# Patient Record
Sex: Male | Born: 1985 | Hispanic: No | Marital: Single | Smoking: Never smoker
Health system: Southern US, Community
[De-identification: ages and names within clinical notes are randomized; demographics above are authoritative.]

## PROBLEM LIST (undated history)

## (undated) ENCOUNTER — Emergency Department: Discharge status: Absent without leave | Payer: 59

## (undated) DIAGNOSIS — R519 Headache, unspecified: Secondary | ICD-10-CM

## (undated) DIAGNOSIS — R42 Dizziness and giddiness: Secondary | ICD-10-CM

## (undated) DIAGNOSIS — R51 Headache: Secondary | ICD-10-CM

## (undated) HISTORY — PX: SHOULDER SURGERY: SHX246

---

## 2014-08-14 ENCOUNTER — Other Ambulatory Visit: Payer: Self-pay | Admitting: Otolaryngology

## 2014-08-14 DIAGNOSIS — J320 Chronic maxillary sinusitis: Secondary | ICD-10-CM

## 2014-08-14 DIAGNOSIS — R0981 Nasal congestion: Secondary | ICD-10-CM

## 2014-08-17 ENCOUNTER — Ambulatory Visit
Admission: RE | Admit: 2014-08-17 | Discharge: 2014-08-17 | Disposition: A | Discharge status: Home / Self Care | Payer: 59 | Source: Ambulatory Visit | Attending: Otolaryngology | Admitting: Otolaryngology

## 2014-08-17 DIAGNOSIS — R0981 Nasal congestion: Secondary | ICD-10-CM

## 2014-08-17 DIAGNOSIS — J320 Chronic maxillary sinusitis: Secondary | ICD-10-CM

## 2015-01-08 NOTE — H&P (Signed)
PREOPERATIVE H&P  Chief Complaint: sinus infections and nasal obstruction  HPI: Larry Pitts is a 29 y.o. male who presents for evaluation of sinus infections and nasal obstruction for a number of years. He has a lot of pressure between his eyes and in the cheek region. A CT scan of the sinus demonstrated a significant septal deviation to the right and mild sinus disease in the anterior ethmoid region and maxillary sinuses. He's taken to the OR for septoplasty, TR and FESS.  No past medical history on file. No past surgical history on file. History   Social History  . Marital Status: Single    Spouse Name: N/A  . Number of Children: N/A  . Years of Education: N/A   Social History Main Topics  . Smoking status: Not on file  . Smokeless tobacco: Not on file  . Alcohol Use: Not on file  . Drug Use: Not on file  . Sexual Activity: Not on file   Other Topics Concern  . Not on file   Social History Narrative  . No narrative on file   No family history on file. Allergies not on file Prior to Admission medications   Not on File     Positive ROS: per HPI  All other systems have been reviewed and were otherwise negative with the exception of those mentioned in the HPI and as above.  Physical Exam: There were no vitals filed for this visit.  General: Alert, no acute distress Oral: Normal oral mucosa and tonsils Nasal: Septal deviation to the right with large turbinates. No polyps. Neck: No palpable adenopathy or thyroid nodules Ear: Ear canal is clear with normal appearing TMs Cardiovascular: Regular rate and rhythm, no murmur.  Respiratory: Clear to auscultation Neurologic: Alert and oriented x 3   Assessment/Plan: SINUS DISEASE, DEVIATED SEPTUM Plan for Procedure(s): NASAL SEPTOPLASTY WITH TURBINATE REDUCTION ANTERIOR ETHMOIDECTOMY BILATERIAL MAXILLARY OSTIA ENLARGEMENT   Dillard Cannon, MD 01/08/2015 2:44 PM

## 2015-01-09 ENCOUNTER — Other Ambulatory Visit: Payer: Self-pay | Admitting: Otolaryngology

## 2015-01-11 ENCOUNTER — Encounter (HOSPITAL_COMMUNITY): Payer: Self-pay

## 2015-01-11 ENCOUNTER — Encounter (HOSPITAL_COMMUNITY)
Admission: RE | Admit: 2015-01-11 | Discharge: 2015-01-11 | Disposition: A | Discharge status: Home / Self Care | Payer: Commercial Managed Care - HMO | Source: Ambulatory Visit | Attending: Otolaryngology | Admitting: Otolaryngology

## 2015-01-11 DIAGNOSIS — J329 Chronic sinusitis, unspecified: Secondary | ICD-10-CM | POA: Insufficient documentation

## 2015-01-11 DIAGNOSIS — Z01812 Encounter for preprocedural laboratory examination: Secondary | ICD-10-CM | POA: Diagnosis present

## 2015-01-11 DIAGNOSIS — J342 Deviated nasal septum: Secondary | ICD-10-CM | POA: Diagnosis not present

## 2015-01-11 HISTORY — DX: Headache, unspecified: R51.9

## 2015-01-11 HISTORY — DX: Dizziness and giddiness: R42

## 2015-01-11 HISTORY — DX: Headache: R51

## 2015-01-11 LAB — CBC
HCT: 45.2 % (ref 39.0–52.0)
HEMOGLOBIN: 15.4 g/dL (ref 13.0–17.0)
MCH: 29.6 pg (ref 26.0–34.0)
MCHC: 34.1 g/dL (ref 30.0–36.0)
MCV: 86.9 fL (ref 78.0–100.0)
PLATELETS: 249 10*3/uL (ref 150–400)
RBC: 5.2 MIL/uL (ref 4.22–5.81)
RDW: 12.9 % (ref 11.5–15.5)
WBC: 4.3 10*3/uL (ref 4.0–10.5)

## 2015-01-11 NOTE — Pre-Procedure Instructions (Signed)
    Larry Pitts  01/11/2015      [id number] - [place], [place] - [address] [place] [address] Phone: [phone] Fax: [phone]    Your procedure is scheduled on 01/17/15.  Report to [facility] Admitting at 845 A.M.  Call this number if you have problems the morning of surgery:  [phone]   Remember:  Do not eat food or drink liquids after midnight.  Take these medicines the morning of surgery with A SIP OF WATER --allegra,flonase   Do not wear jewelry, make-up or nail polish.  Do not wear lotions, powders, or perfumes.  You may wear deodorant.  Do not shave 48 hours prior to surgery.  Men may shave face and neck.  Do not bring valuables to the hospital.  [facility] is not responsible for any belongings or valuables.  Contacts, dentures or bridgework may not be worn into surgery.  Leave your suitcase in the car.  After surgery it may be brought to your room.  For patients admitted to the hospital, discharge time will be determined by your treatment team.  Patients discharged the day of surgery will not be allowed to drive home.   Name and phone number of your driver:    Special instructions:    Please read over the following fact sheets that you were given. Pain Booklet, Coughing and Deep Breathing and Surgical Site Infection Prevention

## 2015-01-16 MED ORDER — CEFAZOLIN SODIUM-DEXTROSE 2-3 GM-% IV SOLR
2.0000 g | INTRAVENOUS | Status: AC
  Administered 2015-01-17: 2 g via INTRAVENOUS
  Filled 2015-01-16: qty 50

## 2015-01-17 ENCOUNTER — Ambulatory Visit (HOSPITAL_COMMUNITY)
Admission: RE | Admit: 2015-01-17 | Discharge: 2015-01-17 | Disposition: A | Discharge status: Home / Self Care | Payer: Commercial Managed Care - HMO | Source: Ambulatory Visit | Attending: Otolaryngology | Admitting: Otolaryngology

## 2015-01-17 ENCOUNTER — Encounter (HOSPITAL_COMMUNITY): Payer: Self-pay | Admitting: General Practice

## 2015-01-17 ENCOUNTER — Ambulatory Visit (HOSPITAL_COMMUNITY): Payer: Commercial Managed Care - HMO | Admitting: Anesthesiology

## 2015-01-17 ENCOUNTER — Encounter (HOSPITAL_COMMUNITY)
Admission: RE | Disposition: A | Discharge status: Home / Self Care | Payer: Self-pay | Source: Ambulatory Visit | Attending: Otolaryngology

## 2015-01-17 DIAGNOSIS — J342 Deviated nasal septum: Secondary | ICD-10-CM | POA: Diagnosis not present

## 2015-01-17 DIAGNOSIS — J343 Hypertrophy of nasal turbinates: Secondary | ICD-10-CM | POA: Insufficient documentation

## 2015-01-17 HISTORY — PX: ETHMOIDECTOMY: SHX5197

## 2015-01-17 HISTORY — PX: NASAL SEPTOPLASTY W/ TURBINOPLASTY: SHX2070

## 2015-01-17 HISTORY — PX: MAXILLARY ANTROSTOMY: SHX2003

## 2015-01-17 SURGERY — SEPTOPLASTY, NOSE, WITH NASAL TURBINATE REDUCTION
Anesthesia: General | Site: Nose | Laterality: Bilateral

## 2015-01-17 MED ORDER — LIDOCAINE HCL (CARDIAC) 20 MG/ML IV SOLN
INTRAVENOUS | Status: DC | PRN
  Administered 2015-01-17: 50 mg via INTRAVENOUS

## 2015-01-17 MED ORDER — BACITRACIN ZINC 500 UNIT/GM EX OINT
TOPICAL_OINTMENT | CUTANEOUS | Status: DC | PRN
  Administered 2015-01-17: 1 via TOPICAL

## 2015-01-17 MED ORDER — DEXAMETHASONE SODIUM PHOSPHATE 10 MG/ML IJ SOLN
INTRAMUSCULAR | Status: DC | PRN
  Administered 2015-01-17: 10 mg via INTRAVENOUS

## 2015-01-17 MED ORDER — CEPHALEXIN 500 MG PO CAPS: 500.0000 mg | ORAL_CAPSULE | Freq: Two times a day (BID) | ORAL | Status: AC

## 2015-01-17 MED ORDER — HYDROCODONE-ACETAMINOPHEN 5-325 MG PO TABS: 1.0000 | ORAL_TABLET | Freq: Four times a day (QID) | ORAL | Status: AC | PRN

## 2015-01-17 MED ORDER — SODIUM CHLORIDE 0.9 % IR SOLN
Status: DC | PRN
  Administered 2015-01-17: 1000 mL

## 2015-01-17 MED ORDER — HYDROMORPHONE HCL 1 MG/ML IJ SOLN
INTRAMUSCULAR | Status: AC
  Filled 2015-01-17: qty 1

## 2015-01-17 MED ORDER — OXYMETAZOLINE HCL 0.05 % NA SOLN
NASAL | Status: DC | PRN
  Administered 2015-01-17: 1

## 2015-01-17 MED ORDER — FENTANYL CITRATE (PF) 250 MCG/5ML IJ SOLN
INTRAMUSCULAR | Status: AC
  Filled 2015-01-17: qty 5

## 2015-01-17 MED ORDER — PHENYLEPHRINE HCL 10 MG/ML IJ SOLN
INTRAMUSCULAR | Status: DC | PRN
  Administered 2015-01-17: 40 ug via INTRAVENOUS

## 2015-01-17 MED ORDER — FENTANYL CITRATE (PF) 100 MCG/2ML IJ SOLN
INTRAMUSCULAR | Status: DC | PRN
  Administered 2015-01-17 (×3): 50 ug via INTRAVENOUS
  Administered 2015-01-17: 100 ug via INTRAVENOUS

## 2015-01-17 MED ORDER — GLYCOPYRROLATE 0.2 MG/ML IJ SOLN
INTRAMUSCULAR | Status: DC | PRN
  Administered 2015-01-17: 0.4 mg via INTRAVENOUS

## 2015-01-17 MED ORDER — MIDAZOLAM HCL 5 MG/5ML IJ SOLN
INTRAMUSCULAR | Status: DC | PRN
  Administered 2015-01-17: 2 mg via INTRAVENOUS

## 2015-01-17 MED ORDER — ONDANSETRON HCL 4 MG/2ML IJ SOLN
INTRAMUSCULAR | Status: DC | PRN
  Administered 2015-01-17: 4 mg via INTRAVENOUS

## 2015-01-17 MED ORDER — LACTATED RINGERS IV SOLN
INTRAVENOUS | Status: DC | PRN
  Administered 2015-01-17 (×2): via INTRAVENOUS

## 2015-01-17 MED ORDER — LACTATED RINGERS IV SOLN
INTRAVENOUS | Status: DC
  Administered 2015-01-17: 09:00:00 via INTRAVENOUS

## 2015-01-17 MED ORDER — DEXAMETHASONE SODIUM PHOSPHATE 10 MG/ML IJ SOLN
INTRAMUSCULAR | Status: AC
Start: 2015-01-17 — End: 2015-01-17
  Filled 2015-01-17: qty 1

## 2015-01-17 MED ORDER — NEOSTIGMINE METHYLSULFATE 10 MG/10ML IV SOLN
INTRAVENOUS | Status: AC
  Filled 2015-01-17: qty 1

## 2015-01-17 MED ORDER — PROPOFOL 10 MG/ML IV BOLUS
INTRAVENOUS | Status: DC | PRN
  Administered 2015-01-17: 150 mg via INTRAVENOUS

## 2015-01-17 MED ORDER — ROCURONIUM BROMIDE 100 MG/10ML IV SOLN
INTRAVENOUS | Status: DC | PRN
  Administered 2015-01-17: 40 mg via INTRAVENOUS

## 2015-01-17 MED ORDER — LIDOCAINE-EPINEPHRINE 1 %-1:100000 IJ SOLN
INTRAMUSCULAR | Status: AC
  Filled 2015-01-17: qty 1

## 2015-01-17 MED ORDER — LIDOCAINE-EPINEPHRINE 1 %-1:100000 IJ SOLN
INTRAMUSCULAR | Status: DC | PRN
  Administered 2015-01-17: 20 mL

## 2015-01-17 MED ORDER — LIDOCAINE HCL (CARDIAC) 20 MG/ML IV SOLN
INTRAVENOUS | Status: AC
  Filled 2015-01-17: qty 5

## 2015-01-17 MED ORDER — ONDANSETRON HCL 4 MG/2ML IJ SOLN
INTRAMUSCULAR | Status: AC
  Filled 2015-01-17: qty 2

## 2015-01-17 MED ORDER — HYDROMORPHONE HCL 1 MG/ML IJ SOLN
0.2500 mg | INTRAMUSCULAR | Status: DC | PRN
  Administered 2015-01-17: 0.25 mg via INTRAVENOUS

## 2015-01-17 MED ORDER — 0.9 % SODIUM CHLORIDE (POUR BTL) OPTIME
TOPICAL | Status: DC | PRN
  Administered 2015-01-17: 1000 mL

## 2015-01-17 MED ORDER — PROPOFOL 10 MG/ML IV BOLUS
INTRAVENOUS | Status: AC
  Filled 2015-01-17: qty 20

## 2015-01-17 MED ORDER — NEOSTIGMINE METHYLSULFATE 10 MG/10ML IV SOLN
INTRAVENOUS | Status: DC | PRN
  Administered 2015-01-17: 3 mg via INTRAVENOUS

## 2015-01-17 MED ORDER — BACITRACIN ZINC 500 UNIT/GM EX OINT
TOPICAL_OINTMENT | CUTANEOUS | Status: AC
  Filled 2015-01-17: qty 28.35

## 2015-01-17 MED ORDER — GLYCOPYRROLATE 0.2 MG/ML IJ SOLN
INTRAMUSCULAR | Status: AC
  Filled 2015-01-17: qty 2

## 2015-01-17 MED ORDER — OXYMETAZOLINE HCL 0.05 % NA SOLN
NASAL | Status: AC
  Filled 2015-01-17: qty 15

## 2015-01-17 MED ORDER — PHENYLEPHRINE 40 MCG/ML (10ML) SYRINGE FOR IV PUSH (FOR BLOOD PRESSURE SUPPORT)
PREFILLED_SYRINGE | INTRAVENOUS | Status: AC
  Filled 2015-01-17: qty 10

## 2015-01-17 MED ORDER — MIDAZOLAM HCL 2 MG/2ML IJ SOLN
INTRAMUSCULAR | Status: AC
  Filled 2015-01-17: qty 4

## 2015-01-17 MED ORDER — SUCCINYLCHOLINE CHLORIDE 20 MG/ML IJ SOLN
INTRAMUSCULAR | Status: AC
  Filled 2015-01-17: qty 1

## 2015-01-17 SURGICAL SUPPLY — 69 items
ATTRACTOMAT 16X20 MAGNETIC DRP (DRAPES) IMPLANT
BLADE 10 SAFETY STRL DISP (BLADE) ×3 IMPLANT
BLADE INF TURB ROT M4 2 5PK (BLADE) ×2 IMPLANT
BLADE INF TURB ROT M4 2MM 5PK (BLADE) ×1
BLADE RAD40 ROTATE 4M 4 5PK (BLADE) IMPLANT
BLADE RAD40 ROTATE 4M 4MM 5PK (BLADE)
BLADE RAD60 ROTATE M4 4 5PK (BLADE) IMPLANT
BLADE RAD60 ROTATE M4 4MM 5PK (BLADE)
BLADE SURG 15 STRL LF DISP TIS (BLADE) IMPLANT
BLADE SURG 15 STRL SS (BLADE)
BLADE TRICUT ROTATE M4 4 5PK (BLADE) ×2 IMPLANT
BLADE TRICUT ROTATE M4 4MM 5PK (BLADE) ×1
CANISTER SUCTION 2500CC (MISCELLANEOUS) ×3 IMPLANT
CLEANER TIP ELECTROSURG 2X2 (MISCELLANEOUS) IMPLANT
CLOSURE WOUND 1/2 X4 (GAUZE/BANDAGES/DRESSINGS)
COAGULATOR SUCT 8FR VV (MISCELLANEOUS) ×3 IMPLANT
DRAPE PROXIMA HALF (DRAPES) IMPLANT
DRESSING NASAL KENNEDY 3.5X.9 (MISCELLANEOUS) IMPLANT
DRESSING TELFA 8X3 (GAUZE/BANDAGES/DRESSINGS) ×3 IMPLANT
DRSG NASAL KENNEDY 3.5X.9 (MISCELLANEOUS)
DRSG NASOPORE 8CM (GAUZE/BANDAGES/DRESSINGS) ×6 IMPLANT
DRSG TELFA 3X8 NADH (GAUZE/BANDAGES/DRESSINGS) ×3 IMPLANT
ELECT CAUTERY BLADE 6.4 (BLADE) ×3 IMPLANT
ELECT COATED BLADE 2.86 ST (ELECTRODE) ×3 IMPLANT
ELECT REM PT RETURN 9FT ADLT (ELECTROSURGICAL) ×3
ELECTRODE REM PT RTRN 9FT ADLT (ELECTROSURGICAL) ×1 IMPLANT
FILTER ARTHROSCOPY CONVERTOR (FILTER) IMPLANT
GAUZE SPONGE 2X2 8PLY STRL LF (GAUZE/BANDAGES/DRESSINGS) IMPLANT
GLOVE BIOGEL PI IND STRL 6.5 (GLOVE) ×1 IMPLANT
GLOVE BIOGEL PI IND STRL 7.0 (GLOVE) ×2 IMPLANT
GLOVE BIOGEL PI INDICATOR 6.5 (GLOVE) ×2
GLOVE BIOGEL PI INDICATOR 7.0 (GLOVE) ×4
GLOVE BIOGEL PI ORTHO PRO SZ7 (GLOVE) ×2
GLOVE ECLIPSE 6.5 STRL STRAW (GLOVE) ×3 IMPLANT
GLOVE PI ORTHO PRO STRL SZ7 (GLOVE) ×1 IMPLANT
GLOVE SS BIOGEL STRL SZ 7.5 (GLOVE) ×2 IMPLANT
GLOVE SUPERSENSE BIOGEL SZ 7.5 (GLOVE) ×4
GOWN STRL REUS W/ TWL LRG LVL3 (GOWN DISPOSABLE) ×2 IMPLANT
GOWN STRL REUS W/ TWL XL LVL3 (GOWN DISPOSABLE) ×1 IMPLANT
GOWN STRL REUS W/TWL LRG LVL3 (GOWN DISPOSABLE) ×4
GOWN STRL REUS W/TWL XL LVL3 (GOWN DISPOSABLE) ×2
KIT BASIN OR (CUSTOM PROCEDURE TRAY) ×3 IMPLANT
KIT ROOM TURNOVER OR (KITS) ×3 IMPLANT
NEEDLE 27GAX1X1/2 (NEEDLE) ×3 IMPLANT
NEEDLE HYPO 25GX1X1/2 BEV (NEEDLE) ×3 IMPLANT
NEEDLE SPNL 25GX3.5 QUINCKE BL (NEEDLE) IMPLANT
NS IRRIG 1000ML POUR BTL (IV SOLUTION) ×3 IMPLANT
PAD ARMBOARD 7.5X6 YLW CONV (MISCELLANEOUS) ×6 IMPLANT
PATTIES SURGICAL .5 X3 (DISPOSABLE) ×3 IMPLANT
PENCIL BUTTON HOLSTER BLD 10FT (ELECTRODE) ×3 IMPLANT
PENCIL FOOT CONTROL (ELECTRODE) IMPLANT
SOLUTION ANTI FOG 6CC (MISCELLANEOUS) ×3 IMPLANT
SPECIMEN JAR SMALL (MISCELLANEOUS) ×6 IMPLANT
SPLINT NASAL DOYLE BI-VL (GAUZE/BANDAGES/DRESSINGS) ×3 IMPLANT
SPONGE GAUZE 2X2 STER 10/PKG (GAUZE/BANDAGES/DRESSINGS)
STRIP CLOSURE SKIN 1/2X4 (GAUZE/BANDAGES/DRESSINGS) IMPLANT
SUT CHROMIC 3 0 PS 2 (SUTURE) IMPLANT
SUT CHROMIC 4 0 PS 2 18 (SUTURE) ×3 IMPLANT
SUT CHROMIC 5 0 P 3 (SUTURE) IMPLANT
SUT ETHILON 3 0 PS 1 (SUTURE) ×3 IMPLANT
SUT ETHILON 4 0 PS 2 18 (SUTURE) IMPLANT
SUT ETHILON 5 0 P 3 18 (SUTURE)
SUT NYLON ETHILON 5-0 P-3 1X18 (SUTURE) IMPLANT
SUT SILK 2 0 FS (SUTURE) ×3 IMPLANT
TOWEL OR 17X24 6PK STRL BLUE (TOWEL DISPOSABLE) ×3 IMPLANT
TRAY ENT MC OR (CUSTOM PROCEDURE TRAY) ×3 IMPLANT
TUBE CONNECTING 12'X1/4 (SUCTIONS) ×1
TUBE CONNECTING 12X1/4 (SUCTIONS) ×2 IMPLANT
WATER STERILE IRR 1000ML POUR (IV SOLUTION) ×3 IMPLANT

## 2015-01-17 NOTE — Discharge Instructions (Signed)
Take your regular meds. Tylenol, motrin or hydrocodone tabs 1-2 every 6 hrs prn pain Start Keflex tonight  1 tab twice per day Return to [facility] office tomorrow at 11 am to have nasal packs removed

## 2015-01-17 NOTE — Interval H&P Note (Signed)
History and Physical Interval Note:  01/17/2015 10:30 AM  Larry Pitts  has presented today for surgery, with the diagnosis of SINUS DISEASE, DEVIATED SEPTUM  The various methods of treatment have been discussed with the patient and family. After consideration of risks, benefits and other options for treatment, the patient has consented to  Procedure(s): NASAL SEPTOPLASTY WITH TURBINATE REDUCTION (N/A) ANTERIOR ETHMOIDECTOMY (N/A) BILATERIAL MAXILLARY OSTIA ENLARGEMENT (Bilateral) as a surgical intervention .  The patient's history has been reviewed, patient examined, no change in status, stable for surgery.  I have reviewed the patient's chart and labs.  Questions were answered to the patient's satisfaction.     [name]

## 2015-01-17 NOTE — Anesthesia Procedure Notes (Signed)
Procedure Name: Intubation Date/Time: 01/17/2015 10:55 AM Performed by: Ferol Luz L Pre-anesthesia Checklist: Patient identified, Emergency Drugs available, Suction available, Patient being monitored and Timeout performed Patient Re-evaluated:Patient Re-evaluated prior to inductionOxygen Delivery Method: Circle system utilized Preoxygenation: Pre-oxygenation with 100% oxygen Intubation Type: IV induction Ventilation: Mask ventilation without difficulty Laryngoscope Size: Mac and 4 Grade View: Grade I Tube type: Oral Tube size: 7.5 mm Number of attempts: 1 Airway Equipment and Method: Stylet Placement Confirmation: ETT inserted through vocal cords under direct vision,  positive ETCO2 and breath sounds checked- equal and bilateral Secured at: 21 cm Tube secured with: Tape Dental Injury: Teeth and Oropharynx as per pre-operative assessment

## 2015-01-17 NOTE — Anesthesia Postprocedure Evaluation (Signed)
  Anesthesia Post-op Note  Patient: Larry Pitts  Procedure(s) Performed: Procedure(s): NASAL SEPTOPLASTY WITH TURBINATE REDUCTION (Bilateral) ANTERIOR ETHMOIDECTOMY (Bilateral) BILATERIAL MAXILLARY OSTIA ENLARGEMENT (Bilateral)  Patient Location: PACU  Anesthesia Type:General  Level of Consciousness: awake and alert   Airway and Oxygen Therapy: Patient Spontanous Breathing  Post-op Pain: Controlled  Post-op Assessment: Post-op Vital signs reviewed, Patient's Cardiovascular Status Stable and Respiratory Function Stable  Post-op Vital Signs: Reviewed  Filed Vitals:   01/17/15 1430  BP: 142/101  Pulse: 46  Temp: 36.8 C  Resp: 14    Complications: No apparent anesthesia complications

## 2015-01-17 NOTE — Brief Op Note (Signed)
01/17/2015  1:10 PM  PATIENT:  Larry Pitts  29 y.o. male  PRE-OPERATIVE DIAGNOSIS:  SINUS DISEASE, DEVIATED SEPTUM  POST-OPERATIVE DIAGNOSIS:  SINUS DISEASE, DEVIATED SEPTUM  PROCEDURE:  Procedure(s): NASAL SEPTOPLASTY WITH TURBINATE REDUCTION (Bilateral) ANTERIOR ETHMOIDECTOMY (Bilateral) BILATERIAL MAXILLARY OSTIA ENLARGEMENT (Bilateral)  SURGEON:  Surgeon(s) and Role:    * Drema Halon, MD - Primary  PHYSICIAN ASSISTANT:   ASSISTANTS: none   ANESTHESIA:   general  EBL:  Total I/O In: 1000 [I.V.:1000] Out: -   BLOOD ADMINISTERED:none  DRAINS: none   LOCAL MEDICATIONS USED:  LIDOCAINE with EPI  20 cc  SPECIMEN:  No Specimen  DISPOSITION OF SPECIMEN:  N/A  COUNTS:  YES  TOURNIQUET:  * No tourniquets in log *  DICTATION: .Other Dictation: Dictation Number [id number]  PLAN OF CARE: Discharge to home after PACU  PATIENT DISPOSITION:  PACU - hemodynamically stable.   Delay start of Pharmacological VTE agent (>24hrs) due to surgical blood loss or risk of bleeding: yes

## 2015-01-17 NOTE — Anesthesia Preprocedure Evaluation (Addendum)
Anesthesia Evaluation  Patient identified by MRN, date of birth, ID band Patient awake    Reviewed: Allergy & Precautions, H&P , NPO status , Patient's Chart, lab work & pertinent test results  Airway Mallampati: I TM Distance: >3 FB Neck ROM: Full    Dental no notable dental hx. (+) Teeth Intact, Dental Advisory Given   Pulmonary neg pulmonary ROS,  breath sounds clear to auscultation  Pulmonary exam normal       Cardiovascular negative cardio ROS  Rhythm:Regular Rate:Normal     Neuro/Psych  Headaches, negative psych ROS   GI/Hepatic negative GI ROS, Neg liver ROS,   Endo/Other  negative endocrine ROS  Renal/GU negative Renal ROS  negative genitourinary   Musculoskeletal   Abdominal   Peds  Hematology negative hematology ROS (+)   Anesthesia Other Findings   Reproductive/Obstetrics negative OB ROS                          Anesthesia Physical Anesthesia Plan  ASA: I  Anesthesia Plan: General   Post-op Pain Management:    Induction: Intravenous  Airway Management Planned: Oral ETT  Additional Equipment:   Intra-op Plan:   Post-operative Plan: Extubation in OR  Informed Consent: I have reviewed the patients History and Physical, chart, labs and discussed the procedure including the risks, benefits and alternatives for the proposed anesthesia with the patient or authorized representative who has indicated his/her understanding and acceptance.   Dental advisory given  Plan Discussed with: CRNA  Anesthesia Plan Comments:         Anesthesia Quick Evaluation  

## 2015-01-17 NOTE — Transfer of Care (Signed)
Immediate Anesthesia Transfer of Care Note  Patient: Larry Pitts  Procedure(s) Performed: Procedure(s): NASAL SEPTOPLASTY WITH TURBINATE REDUCTION (Bilateral) ANTERIOR ETHMOIDECTOMY (Bilateral) BILATERIAL MAXILLARY OSTIA ENLARGEMENT (Bilateral)  Patient Location: PACU  Anesthesia Type:General  Level of Consciousness: awake, alert , oriented and patient cooperative  Airway & Oxygen Therapy: Patient Spontanous Breathing and Patient connected to nasal cannula oxygen  Post-op Assessment: Report given to RN, Post -op Vital signs reviewed and stable and Patient moving all extremities  Post vital signs: Reviewed and stable  Last Vitals:  Filed Vitals:   01/17/15 0850  BP: 132/81  Pulse: 50  Temp: 36.2 C  Resp: 20    Complications: No apparent anesthesia complications

## 2015-01-18 ENCOUNTER — Encounter (HOSPITAL_COMMUNITY): Payer: Self-pay | Admitting: Otolaryngology

## 2015-01-18 NOTE — Op Note (Signed)
NAMEPESACH, FRISCH NO.:  [id number]  MEDICAL RECORD NO.:  [id number]  LOCATION:  [facility]                         FACILITY:  [facility]  PHYSICIAN:  Kristine Garbe. Ezzard Standing, M.D.DATE OF BIRTH:  1985-09-18  DATE OF PROCEDURE:  01/17/2015 DATE OF DISCHARGE:  01/17/2015                              OPERATIVE REPORT   PREOPERATIVE DIAGNOSES: 1. 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 2. Turbinate hypertrophy bilaterally. 3. History of recurrent sinus infections.  POSTOPERATIVE DIAGNOSES: 1. 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 2. Turbinate hypertrophy bilaterally. 3. History of recurrent sinus infections.  OPERATIONS PERFORMED: 1. Septoplasty with outfracture of right nasal bone. 2. Bilateral inferior turbinate reductions with Medtronic turbinate     blade. 3. Functional endoscopic sinus surgery with bilateral anterior     ethmoidectomy and bilateral maxillary ostial enlargement.  SURGEON:  Kristine Garbe. Ezzard Standing, M.D.  ANESTHESIA:  General endotracheal.  COMPLICATIONS:  None.  BRIEF CLINICAL NOTE:  Larry Pitts is a 29 year old gentleman who has had chronic trouble breathing through his nose as well as frequent sinus infections with a lot of pressure between his eyes and the cheek region. On exam, he has a severe septal deviation to the right with a very narrow right nasal passageway.  He has turbinate hypertrophy on both sides with compensatory turbinate hypertrophy on the left inferior turbinate.  He had a CT scan that showed a mucoperiosteal thickening within the maxillary sinuses bilaterally as well as a minimal amount of disease within the ethmoid area.  The frontal and sphenoid sinuses were clear.  He had a large nasofrontal ducts.  Also, on exam, he has slight depression of the right inferior nasal bone which also contributes to occlusion of the right nasal airway.  He has most of his trouble breathing to  the right side and sometimes on the left side.  He has had intermittent problems with pressure in the cheek area and between the eyes.  He was taken to the operating room at this time for septoplasty, turbinate reductions, and a limited functional endoscopic sinus surgery. Also, tried to outfracture the right inferior nasal bone slightly to improve in right nasal airway.  Of note, he has some collapse of the right inferolateral cartilage.  DESCRIPTION OF PROCEDURE:  After adequate general endotracheal anesthesia, the patient received 2 g Ancef IV preoperatively.  Nose was prepped and draped.  The nose was then further prepped with cotton pledgets soaked in Afrin and the septum turbinates and middle meatus area was injected with Xylocaine with epinephrine for hemostasis. Because of the severe septal deformity to the right, it was unable to approach the right middle meatus.  First septoplasty was performed. Hemitransfixion incision was made along the caudal edge of septum on the right side.  Mucoperiosteal flaps elevated posteriorly.  The anterior cartilaginous septum was bowed to the right with concavity on the left in the inferior portion of the cartilaginous septum bowed out into the left airway.  Inferior 2 mm of thickened cartilage of the septum was removed in order to allow the septum to return more to midline.  In addition, a vertical incision was made  just anterior to the vomer bone posteriorly.  Some of the vomer bone and some thickened cartilaginous septum was removed more posteriorly and this allowed the anterior cartilaginous septum return more to midline.  In addition, the patient had a large bony spur on the right side which was removed after elevating mucoperiosteal flaps on either side.  This completed the septoplasty portion of the procedure.  Next, using the endoscopes, the right middle meatus was inspected first.  The uncinate process was incised and removed.  The  maxillary ostia were identified and enlarged to approximately a 1 cm to a 1.5 cm in size.  Few of the anterior ethmoid cells were opened up and mucosa was debrided with a microdebrider.  Next, a similar procedure was performed on the left side.  The uncinate process was incised and removed.  The maxillary ostia were identified and enlarged with straight through cut forceps and back cutting forceps to about 1 cm to 1.5 cm in size and few of the anterior ethmoid cells were opened up and cleaned out with microdebrider.  Following this, using the Medtronic turbinate blade, the inferior turbinate reductions were performed bilaterally and the turbinate bone was outfractured bilaterally.  Hemostasis was obtained with suction cautery.  Using the butter knife, the right inferior nasal bone was slightly outfractured or attempted to be slightly fractured. This completed the procedure.  After obtaining adequate hemostasis, the hemitransfixion incision was closed with interrupted 4-0 chromic sutures.  The cartilaginous septum was placed back on the top of the maxillary crest and midline and the septum was basted with a 4-0 chromic suture.  Silastic splints were secured to either side of the septum with a 3-0 nylon suture.  Nasal pore was placed within the anterior ethmoid on both sides and the nose was packed with Telfa soaked in bacitracin ointment on both sides.  The patient was subsequently awakened from anesthesia and transferred to the recovery room, postop doing well.  He will be discharged home later this afternoon.  He was given Keflex 500 mg b.i.d. for 1 week, Tylenol and Vicodin p.r.n. pain.  We will have him follow up in my office tomorrow to have his nasal packs removed.          ______________________________ Kristine Garbe Ezzard Standing, M.D.     CEN/MEDQ  D:  01/17/2015  T:  01/18/2015  Job:  [id number]  cc:   Kristine Garbe. Ezzard Standing, M.D.'s Office

## 2016-06-30 ENCOUNTER — Other Ambulatory Visit: Payer: Self-pay | Admitting: Neurology

## 2016-06-30 DIAGNOSIS — R51 Headache: Principal | ICD-10-CM

## 2016-06-30 DIAGNOSIS — R519 Headache, unspecified: Secondary | ICD-10-CM

## 2016-07-10 ENCOUNTER — Inpatient Hospital Stay
Admission: RE | Admit: 2016-07-10 | Discharge: 2016-07-10 | Disposition: A | Discharge status: Home / Self Care | Payer: 59 | Source: Ambulatory Visit | Attending: Neurology | Admitting: Neurology

## 2016-07-11 ENCOUNTER — Ambulatory Visit
Admission: RE | Admit: 2016-07-11 | Discharge: 2016-07-11 | Disposition: A | Discharge status: Home / Self Care | Payer: 59 | Source: Ambulatory Visit | Attending: Neurology | Admitting: Neurology

## 2016-07-11 DIAGNOSIS — R51 Headache: Principal | ICD-10-CM

## 2016-07-11 DIAGNOSIS — R519 Headache, unspecified: Secondary | ICD-10-CM

## 2017-11-20 IMAGING — MR MR HEAD W/O CM
7 series · 48 of 48 positions shown · non-contrast
Comparison: None.

CLINICAL DATA: Headache. History of migraines with occasional
blurry vision which is a new or symptom.

EXAM:
MRI HEAD WITHOUT CONTRAST
TECHNIQUE: Multiplanar, multiecho pulse sequences of the brain and surrounding
structures were obtained without intravenous contrast.

[Series 2: T1 · sagittal · 5.0mm · 0.45mm/px · 3 of 21 slices shown]
[im 1/21]
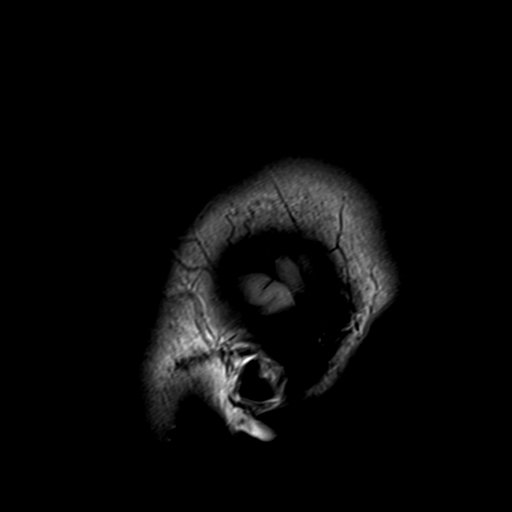
[im 11/21]
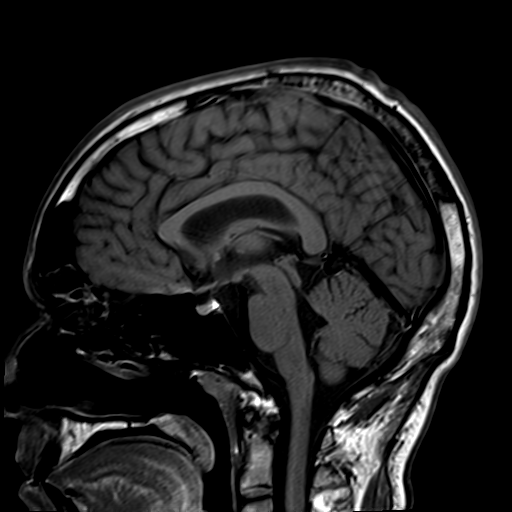
[im 21/21]
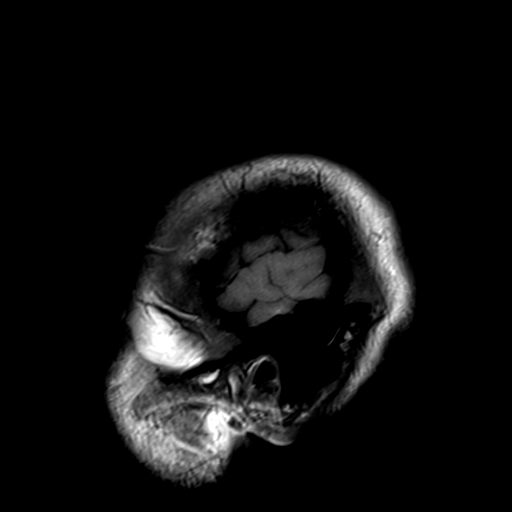

[Series 3: ax ep2d_diff_(id)_trace · axial · 3.0mm · 1.80mm/px · z∈[-8,+139]mm · 14 of 98 slices shown]
[im 1/98]
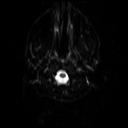
[im 8/98]
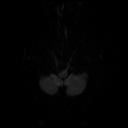
[im 15/98]
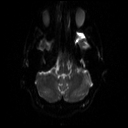
[im 23/98]
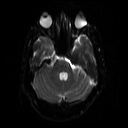
[im 30/98]
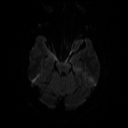
[im 38/98]
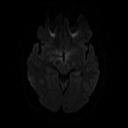
[im 45/98]
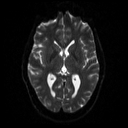
[im 53/98]
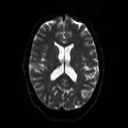
[im 60/98]
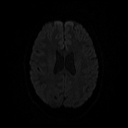
[im 68/98]
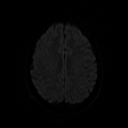
[im 75/98]
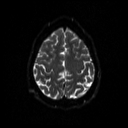
[im 83/98]
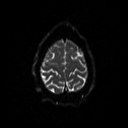
[im 90/98]
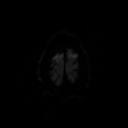
[im 98/98]
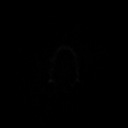

[Series 4: ax ep2d_diff_(id)_trace_adc · axial · 3.0mm · 1.80mm/px · z∈[-8,+139]mm · 7 of 49 slices shown]
[im 1/49]
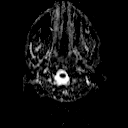
[im 9/49]
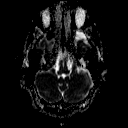
[im 17/49]
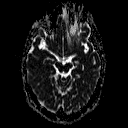
[im 25/49]
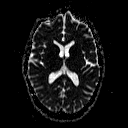
[im 33/49]
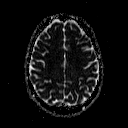
[im 41/49]
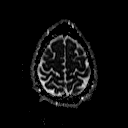
[im 49/49]
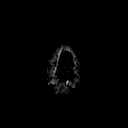

[Series 6: swi_images · axial · 2.0mm · 0.90mm/px · z∈[-16,+141]mm · 11 of 80 slices shown]
[im 1/80]
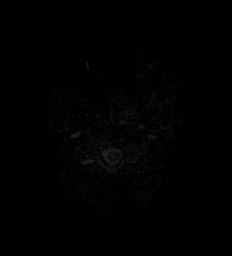
[im 8/80]
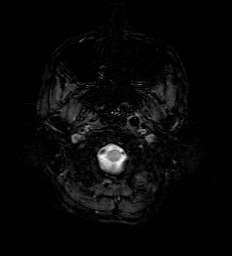
[im 16/80]
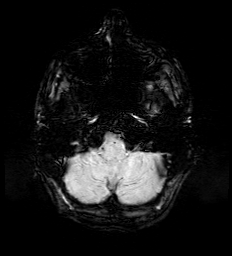
[im 24/80]
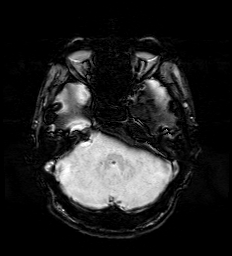
[im 32/80]
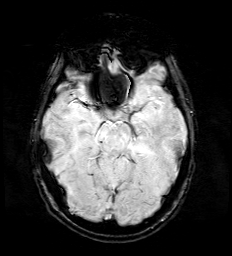
[im 40/80]
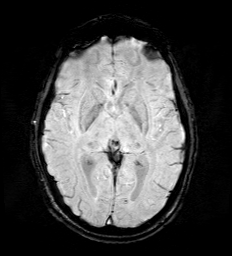
[im 48/80]
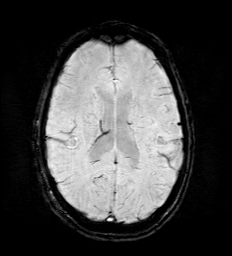
[im 56/80]
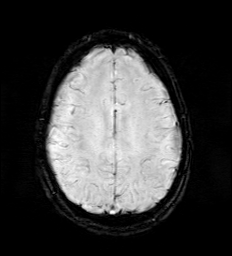
[im 64/80]
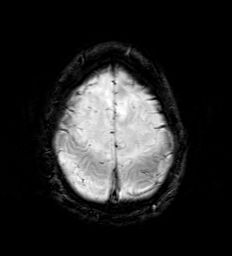
[im 72/80]
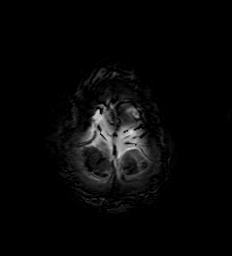
[im 80/80]
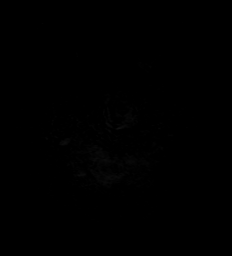

[Series 7: FLAIR · axial · 3.0mm · 0.43mm/px · z∈[-4,+131]mm · 6 of 46 slices shown]
[im 1/46]
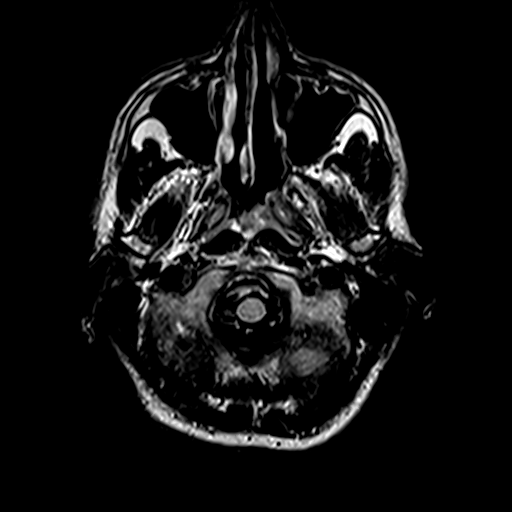
[im 10/46]
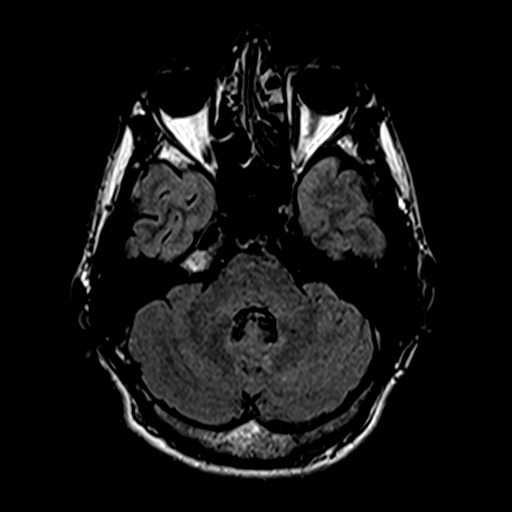
[im 19/46]
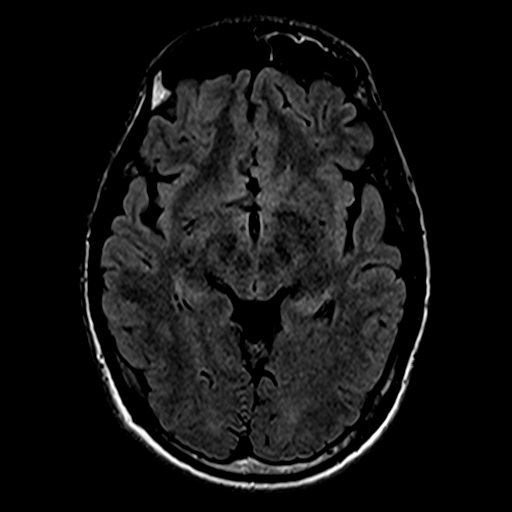
[im 28/46]
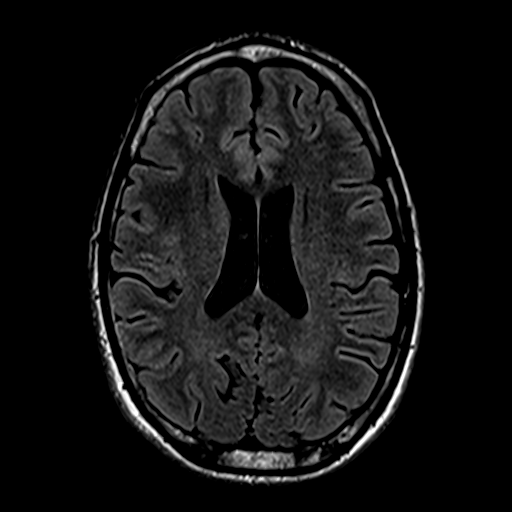
[im 37/46]
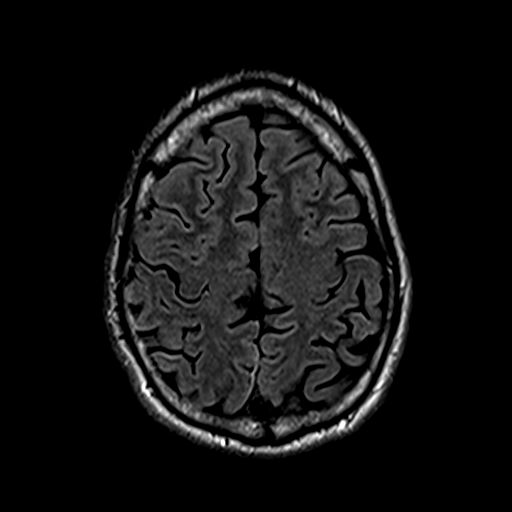
[im 46/46]
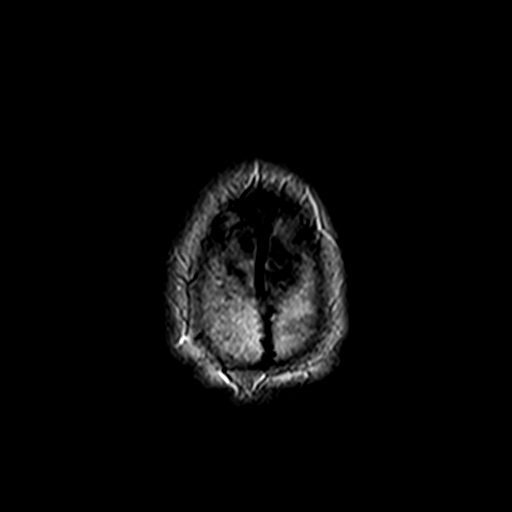

[Series 9: T2 · axial · 5.0mm · 0.60mm/px · z∈[-8,+129]mm · 3 of 23 slices shown (1 of 2)]
[im 1/23]
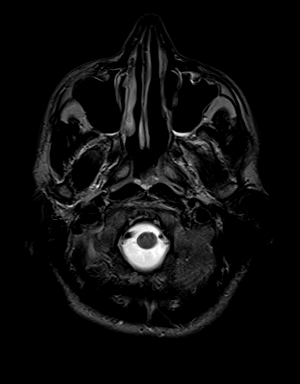
[im 12/23]
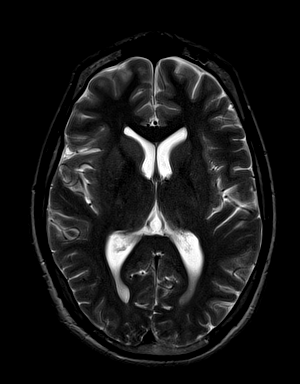
[im 23/23]
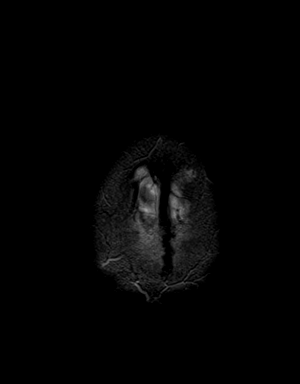

[Series 10: T2 · coronal · 5.0mm · 0.45mm/px · 4 of 30 slices shown (2 of 2)]
[im 1/30]
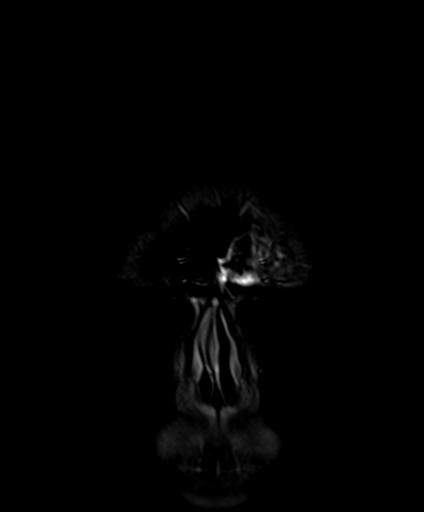
[im 10/30]
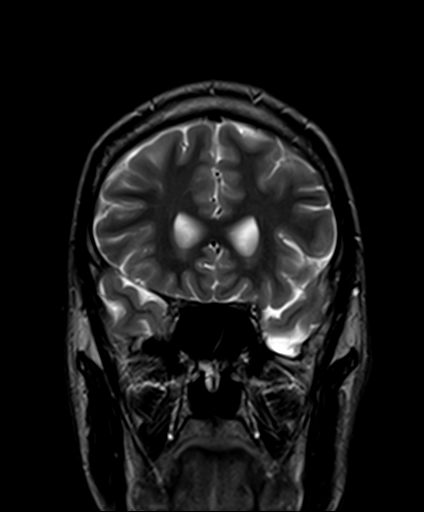
[im 20/30]
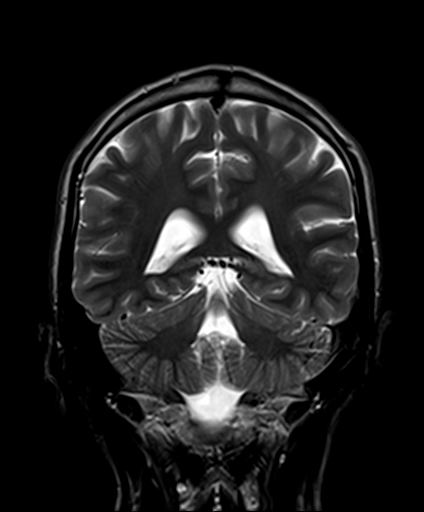
[im 30/30]
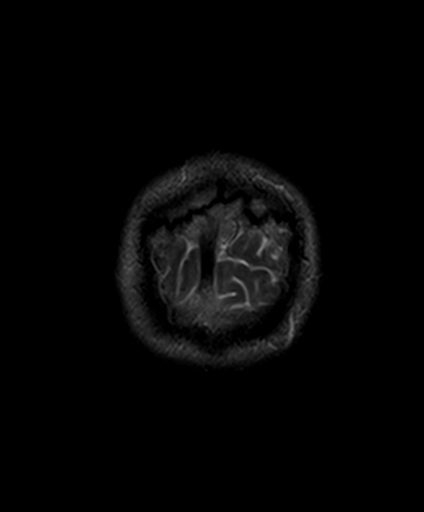

[48 of 48 positions shown; findings below may reference images not displayed]

FINDINGS: Brain: No acute or remote infarction, hemorrhage, hydrocephalus, or
mass. Normal brain volume. No white matter disease. 2 cm CSF
intensity structure in the left middle cranial fossa without notable
mass effect on the temporal pole.

Vascular: Normal flow voids.

Skull and upper cervical spine: Normal marrow signal.

Sinuses/Orbits: Moderate mucosal thickening in the left frontal and
anterior ethmoid air cells. Mucosal thickening on the floor of the
maxillary sinuses with retention cysts.

Other: Few small mucous retention cysts in the nasopharynx.
IMPRESSION: Normal appearance of the brain.

Small arachnoid cyst in the left middle cranial fossa without noted
mass-effect.

Chronic sinusitis, progressed from 0285 sinus CT.

## 2019-03-31 ENCOUNTER — Encounter (INDEPENDENT_AMBULATORY_CARE_PROVIDER_SITE_OTHER): Payer: Self-pay
